# Patient Record
Sex: Female | Born: 2005 | Race: White | Hispanic: No | Marital: Single | Smoking: Never smoker
Health system: Southern US, Community
[De-identification: ages and names within clinical notes are randomized; demographics above are authoritative.]

## PROBLEM LIST (undated history)

## (undated) DIAGNOSIS — Z8489 Family history of other specified conditions: Secondary | ICD-10-CM

## (undated) DIAGNOSIS — J45909 Unspecified asthma, uncomplicated: Secondary | ICD-10-CM

## (undated) HISTORY — PX: NO PAST SURGERIES: SHX2092

---
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
## 2024-07-12 ENCOUNTER — Ambulatory Visit: Admitting: Internal Medicine

## 2024-07-12 NOTE — Progress Notes (Deleted)
 Subjective:    Patient ID: Michelle Lara, female    DOB: 24-Mar-2006, 18 y.o.   MRN: [id number]  HPI  Patient presents to clinic today to establish care and for management of the conditions listed below.  Asthma:  Review of Systems   Past Medical History:  Diagnosis Date   Asthma    Family history of adverse reaction to anesthesia    uncle - rash and angioedema with anesthesia for gastric banding surgery    Current Outpatient Medications  Medication Sig Dispense Refill   acetaminophen -codeine  (TYLENOL  #3) 300-30 MG tablet Take 1 tablet by mouth every 4 (four) hours as needed for moderate pain. 8 tablet 0   fluticasone (FLONASE) 50 MCG/ACT nasal spray      glycopyrrolate  (ROBINUL ) 1 MG tablet   12   methocarbamol  (ROBAXIN ) 500 MG tablet Take 1 tablet (500 mg total) by mouth every 8 (eight) hours as needed for muscle spasms. 16 tablet 0   norethindrone-ethinyl estradiol-iron (ESTROSTEP FE) 1-20/1-30/1-35 MG-MCG tablet Take 1 tablet by mouth daily.     predniSONE  (DELTASONE ) 20 MG tablet Take 2 tablets (40 mg total) by mouth daily. 10 tablet 0   No current facility-administered medications for this visit.    No Known Allergies  Family History  Problem Relation Age of Onset   Diabetes Mother    Hyperlipidemia Mother    Asthma Mother    Environmental Allergies Mother    Hyperlipidemia Father     Social History   Socioeconomic History   Marital status: Single    Spouse name: Not on file   Number of children: Not on file   Years of education: Not on file   Highest education level: Not on file  Occupational History   Not on file  Tobacco Use   Smoking status: Never   Smokeless tobacco: Never  Vaping Use   Vaping status: Never Used  Substance and Sexual Activity   Alcohol use: No   Drug use: Never   Sexual activity: Not on file  Other Topics Concern   Not on file  Social History Narrative   Not on file   Social Drivers of Health   Financial Resource Strain:  Not on file  Food Insecurity: Not on file  Transportation Needs: Not on file  Physical Activity: Not on file  Stress: Not on file  Social Connections: Not on file  Intimate Partner Violence: Not on file     Constitutional: Denies fever, malaise, fatigue, headache or abrupt weight changes.  HEENT: Denies eye pain, eye redness, ear pain, ringing in the ears, wax buildup, runny nose, nasal congestion, bloody nose, or sore throat. Respiratory: Denies difficulty breathing, shortness of breath, cough or sputum production.   Cardiovascular: Denies chest pain, chest tightness, palpitations or swelling in the hands or feet.  Gastrointestinal: Denies abdominal pain, bloating, constipation, diarrhea or blood in the stool.  GU: Denies urgency, frequency, pain with urination, burning sensation, blood in urine, odor or discharge. Musculoskeletal: Denies decrease in range of motion, difficulty with gait, muscle pain or joint pain and swelling.  Skin: Denies redness, rashes, lesions or ulcercations.  Neurological: Denies dizziness, difficulty with memory, difficulty with speech or problems with balance and coordination.  Psych: Denies anxiety, depression, SI/HI.  No other specific complaints in a complete review of systems (except as listed in HPI above).      Objective:   Physical Exam  There were no vitals taken for this visit. Wt Readings from Last  3 Encounters:  07/24/23 175 lb (79.4 kg) (94%, Z= 1.57)*  02/03/19 156 lb 15.5 oz (71.2 kg) (94%, Z= 1.59)*  06/11/18 157 lb (71.2 kg) (96%, Z= 1.74)*   * Growth percentiles are based on CDC (Girls, 2-20 Years) data.    General: Appears their stated age, well developed, well nourished in NAD. Skin: Warm, dry and intact. No rashes, lesions or ulcerations noted. HEENT: Head: normal shape and size; Eyes: sclera white, no icterus, conjunctiva pink, PERRLA and EOMs intact; Ears: Tm's gray and intact, normal light reflex; Nose: mucosa pink and moist,  septum midline; Throat/Mouth: Teeth present, mucosa pink and moist, no exudate, lesions or ulcerations noted.  Neck:  Neck supple, trachea midline. No masses, lumps or thyromegaly present.  Cardiovascular: Normal rate and rhythm. S1,S2 noted.  No murmur, rubs or gallops noted. No JVD or BLE edema. No carotid bruits noted. Pulmonary/Chest: Normal effort and positive vesicular breath sounds. No respiratory distress. No wheezes, rales or ronchi noted.  Abdomen: Soft and nontender. Normal bowel sounds. No distention or masses noted. Liver, spleen and kidneys non palpable. Musculoskeletal: Normal range of motion. No signs of joint swelling. No difficulty with gait.  Neurological: Alert and oriented. Cranial nerves II-XII grossly intact. Coordination normal.  Psychiatric: Mood and affect normal. Behavior is normal. Judgment and thought content normal.         Assessment & Plan:    RTC in 1 year for your annual exam Angeline Laura, NP
